# Patient Record
Sex: Male | Born: 2000 | Race: White | Hispanic: No | Marital: Single | Smoking: Never smoker
Health system: Southern US, Community
[De-identification: ages and names within clinical notes are randomized; demographics above are authoritative.]

---
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
## 2018-01-02 ENCOUNTER — Emergency Department (HOSPITAL_COMMUNITY)
Admission: EM | Admit: 2018-01-02 | Discharge: 2018-01-02 | Disposition: A | Discharge status: Home / Self Care | Payer: 59 | Attending: Emergency Medicine | Admitting: Emergency Medicine

## 2018-01-02 ENCOUNTER — Encounter (HOSPITAL_COMMUNITY): Payer: Self-pay | Admitting: Emergency Medicine

## 2018-01-02 DIAGNOSIS — R1011 Right upper quadrant pain: Secondary | ICD-10-CM | POA: Diagnosis present

## 2018-01-02 DIAGNOSIS — K5900 Constipation, unspecified: Secondary | ICD-10-CM | POA: Diagnosis not present

## 2018-01-02 LAB — COMPREHENSIVE METABOLIC PANEL
ALT: 27 U/L (ref 17–63)
AST: 31 U/L (ref 15–41)
Albumin: 4 g/dL (ref 3.5–5.0)
Alkaline Phosphatase: 90 U/L (ref 38–126)
Anion gap: 6 (ref 5–15)
BUN: 12 mg/dL (ref 6–20)
CO2: 30 mmol/L (ref 22–32)
CREATININE: 0.85 mg/dL (ref 0.61–1.24)
Calcium: 9.2 mg/dL (ref 8.9–10.3)
Chloride: 104 mmol/L (ref 101–111)
GFR calc Af Amer: >60 mL/min (ref >60)
Glucose, Bld: 96 mg/dL (ref 65–99)
Potassium: 4 mmol/L (ref 3.5–5.1)
Sodium: 140 mmol/L (ref 135–145)
TOTAL PROTEIN: 6.8 g/dL (ref 6.5–8.1)
Total Bilirubin: 1.3 mg/dL — ABNORMAL HIGH (ref 0.3–1.2)

## 2018-01-02 LAB — URINALYSIS, ROUTINE W REFLEX MICROSCOPIC
Bilirubin Urine: NEGATIVE
GLUCOSE, UA: NEGATIVE mg/dL
Hgb urine dipstick: NEGATIVE
KETONES UR: NEGATIVE mg/dL
LEUKOCYTES UA: NEGATIVE
NITRITE: NEGATIVE
PROTEIN: NEGATIVE mg/dL
Specific Gravity, Urine: 1.013 (ref 1.005–1.030)
pH: 7 (ref 5.0–8.0)

## 2018-01-02 LAB — CBC
HCT: 42.6 % (ref 39.0–52.0)
Hemoglobin: 14.4 g/dL (ref 13.0–17.0)
MCH: 30.1 pg (ref 26.0–34.0)
MCHC: 33.8 g/dL (ref 30.0–36.0)
MCV: 89.1 fL (ref 78.0–100.0)
PLATELETS: 188 10*3/uL (ref 150–400)
RBC: 4.78 MIL/uL (ref 4.22–5.81)
RDW: 12.3 % (ref 11.5–15.5)
WBC: 5.5 10*3/uL (ref 4.0–10.5)

## 2018-01-02 LAB — LIPASE, BLOOD: Lipase: 34 U/L (ref 11–51)

## 2018-01-02 MED ORDER — POLYETHYLENE GLYCOL 3350 17 G PO PACK: 17.0000 g | PACK | Freq: Every day | ORAL | 0 refills | Status: AC | PRN

## 2018-01-02 MED ORDER — PANTOPRAZOLE SODIUM 40 MG PO TBEC: 40.0000 mg | DELAYED_RELEASE_TABLET | Freq: Two times a day (BID) | ORAL | 0 refills | Status: AC

## 2018-01-02 MED ORDER — GI COCKTAIL ~~LOC~~
30.0000 mL | Freq: Once | ORAL | Status: AC
  Administered 2018-01-02: 30 mL via ORAL
  Filled 2018-01-02: qty 30

## 2018-01-02 MED ORDER — SUCRALFATE 1 GM/10ML PO SUSP: 1.0000 g | Freq: Three times a day (TID) | ORAL | 0 refills | Status: AC

## 2018-01-02 MED ORDER — MAGNESIUM CITRATE PO SOLN: 1.0000 | Freq: Once | ORAL | 0 refills | Status: AC

## 2018-01-02 NOTE — ED Notes (Signed)
Patient left at this time with all belongings. 

## 2018-01-02 NOTE — ED Triage Notes (Signed)
Reports having recurrent RUQ abdominal pain with heartburn for the last three months.  Had a ct scan here on Thursday.  Reports he feels like it is his gallbladder.

## 2018-01-02 NOTE — ED Provider Notes (Signed)
[facility] Provider Note   CSN: [id number] Arrival date & time: 01/02/18  0019     History   Chief Complaint Chief Complaint  Patient presents with  . Abdominal Pain    HPI Carlean JewsBenjamin Munar is a 17 y.o. male.  Patient presents to the ER for evaluation of abdominal pain.  He has been experiencing progressively worsening right upper quadrant and central upper abdominal pain for several months.  He has now developed acid reflux type indigestion symptoms with this.  He reports that pain worsens when he eats and he gets immediately bloated after eating.  He is feeling constipated and has noticed a change in his number of bowel movements as well as character of bowel movements.  Patient is undergoing evaluation by Dr. Loreta AveMann, gastroenterology.  He had an outpatient ultrasound of his right upper quadrant that did not show any acute gallbladder abnormality.  He had a HIDA scan that was normal.  He most recently had a CAT scan that showed constipation but no other abnormality.     History reviewed. No pertinent past medical history.  There are no active problems to display for this patient.   History reviewed. No pertinent surgical history.      Home Medications    Prior to Admission medications   Medication Sig Start Date End Date Taking? Authorizing Provider  magnesium citrate SOLN Take 296 mLs (1 Bottle total) by mouth once for 1 dose. 01/02/18 01/02/18  Gilda CreasePollina, Hallie Ertl J, MD  pantoprazole (PROTONIX) 40 MG tablet Take 1 tablet (40 mg total) by mouth 2 (two) times daily before a meal. 01/02/18   Cal Gindlesperger, Canary Brimhristopher J, MD  polyethylene glycol (MIRALAX / GLYCOLAX) packet Take 17 g by mouth daily as needed for moderate constipation. 01/02/18   Gilda CreasePollina, Nitish Roes J, MD  sucralfate (CARAFATE) 1 GM/10ML suspension Take 10 mLs (1 g total) by mouth 4 (four) times daily -  with meals and at bedtime. 01/02/18   Gilda CreasePollina, Star Cheese J, MD    Family  History No family history on file.  Social History Social History   Tobacco Use  . Smoking status: Never Smoker  . Smokeless tobacco: Never Used  Substance Use Topics  . Alcohol use: Yes    Comment: rarely  . Drug use: Never     Allergies   Patient has no known allergies.   Review of Systems Review of Systems  Gastrointestinal: Positive for abdominal pain and constipation.  All other systems reviewed and are negative.    Physical Exam Updated Vital Signs BP 130/85 (BP Location: Left Arm)   Pulse (!) 46   Temp (!) 97.4 F (36.3 C) (Oral)   Resp 18   Ht 5\' 11"  (1.803 m)   Wt 79.4 kg (175 lb)   SpO2 100%   BMI 24.41 kg/m   Physical Exam  Constitutional: He is oriented to person, place, and time. He appears well-developed and well-nourished. No distress.  HENT:  Head: Normocephalic and atraumatic.  Right Ear: Hearing normal.  Left Ear: Hearing normal.  Nose: Nose normal.  Mouth/Throat: Oropharynx is clear and moist and mucous membranes are normal.  Eyes: Pupils are equal, round, and reactive to light. Conjunctivae and EOM are normal.  Neck: Normal range of motion. Neck supple.  Cardiovascular: Regular rhythm, S1 normal and S2 normal. Exam reveals no gallop and no friction rub.  No murmur heard. Pulmonary/Chest: Effort normal and breath sounds normal. No respiratory distress. He exhibits no tenderness.  Abdominal:  Soft. Normal appearance and bowel sounds are normal. There is no hepatosplenomegaly. There is generalized tenderness and tenderness in the right upper quadrant. There is no rebound, no guarding, no tenderness at McBurney's point and negative Murphy's sign. No hernia.  Musculoskeletal: Normal range of motion.  Neurological: He is alert and oriented to person, place, and time. He has normal strength. No cranial nerve deficit or sensory deficit. Coordination normal. GCS eye subscore is 4. GCS verbal subscore is 5. GCS motor subscore is 6.  Skin: Skin is warm,  dry and intact. No rash noted. No cyanosis.  Psychiatric: He has a normal mood and affect. His speech is normal and behavior is normal. Thought content normal.  Nursing note and vitals reviewed.    ED Treatments / Results  Labs (all labs ordered are listed, but only abnormal results are displayed) Labs Reviewed  COMPREHENSIVE METABOLIC PANEL - Abnormal; Notable for the following components:      Result Value   Total Bilirubin 1.3 (*)    All other components within normal limits  URINALYSIS, ROUTINE W REFLEX MICROSCOPIC - Abnormal; Notable for the following components:   APPearance HAZY (*)    All other components within normal limits  LIPASE, BLOOD  CBC    EKG None  Radiology Ct Abdomen Pelvis W Contrast  Result Date: 01/01/2018 CLINICAL DATA:  Right upper quadrant pain for 3 months. EXAM: CT ABDOMEN AND PELVIS WITH CONTRAST TECHNIQUE: Multidetector CT imaging of the abdomen and pelvis was performed using the standard protocol following bolus administration of intravenous contrast. CONTRAST:  ISOVUE-300 IOPAMIDOL (ISOVUE-300) INJECTION 61% COMPARISON:  None. FINDINGS: Lower Chest: No acute findings. Hepatobiliary: No hepatic masses identified. Gallbladder is unremarkable. No evidence of biliary ductal dilatation. Pancreas:  No mass or inflammatory changes. Spleen: Within normal limits in size and appearance. Adrenals/Urinary Tract: No masses identified. No evidence of hydronephrosis. Stomach/Bowel: No evidence of obstruction, inflammatory process or abnormal fluid collections. Normal appendix visualized. Moderate to large amount of stool seen throughout colon. Vascular/Lymphatic: No pathologically enlarged lymph nodes. No abdominal aortic aneurysm. Reproductive:  No mass or other significant abnormality. Other:  None. Musculoskeletal: No suspicious bone lesions identified. 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 IMPRESSION:  No acute findings within the abdomen or pelvis. Moderate to large stool burden noted; recommend clinical correlation for possible constipation. Electronically Signed   By: Myles Rosenthal M.D.   On: 01/01/2018 09:35    Procedures Procedures (including critical care time)  Medications Ordered in ED Medications  gi cocktail (Maalox,Lidocaine,Donnatal) (30 mLs Oral Given 01/02/18 1610)     Initial Impression / Assessment and Plan / ED Course  I have reviewed the triage vital signs and the nursing notes.  Pertinent labs & imaging results that were available during my care of the patient were reviewed by me and considered in my medical decision making (see chart for details).     Examination reveals a very slight right upper quadrant and epigastric tenderness but no guarding or rebound.  Outpatient studies were reviewed.  He has had ultrasound, HIDA scan, CAT scan that does not show any acute abnormality.  Dr. Loreta Ave started Linzess and I agree with this, recommended he continue this.  CT scan did show increased stool burden, will give him a slight bowel prep.  I do believe that a lot of his symptoms are likely secondary to irritable bowel.  I cannot, however, rule out gastritis peptic ulcer disease, duodenal ulcer.  Patient  will therefore be aggressively treated for these possibilities empirically.  He will follow-up with Dr. Loreta Ave for consideration of endoscopy.  Final Clinical Impressions(s) / ED Diagnoses   Final diagnoses:  Right upper quadrant abdominal pain  Constipation, unspecified constipation type    ED Discharge Orders        Ordered    polyethylene glycol (MIRALAX / GLYCOLAX) packet  Daily PRN     01/02/18 0638    magnesium citrate SOLN   Once     01/02/18 0638    pantoprazole (PROTONIX) 40 MG tablet  2 times daily before meals     01/02/18 0638    sucralfate (CARAFATE) 1 GM/10ML suspension  3 times daily with meals & bedtime     01/02/18 1610       Gilda Crease,  MD 01/02/18 [phone]

## 2019-04-14 ENCOUNTER — Other Ambulatory Visit: Payer: Self-pay

## 2019-04-14 ENCOUNTER — Encounter: Payer: Self-pay | Admitting: Sports Medicine

## 2019-04-14 ENCOUNTER — Ambulatory Visit (INDEPENDENT_AMBULATORY_CARE_PROVIDER_SITE_OTHER): Payer: 59 | Admitting: Sports Medicine

## 2019-04-14 VITALS — BP 112/72 | Ht 71.0 in | Wt 175.0 lb

## 2019-04-14 DIAGNOSIS — Z8619 Personal history of other infectious and parasitic diseases: Secondary | ICD-10-CM | POA: Diagnosis not present

## 2019-04-14 DIAGNOSIS — I400 Infective myocarditis: Secondary | ICD-10-CM | POA: Diagnosis not present

## 2019-04-14 DIAGNOSIS — Z8616 Personal history of COVID-19: Secondary | ICD-10-CM | POA: Insufficient documentation

## 2019-04-14 NOTE — Progress Notes (Signed)
PCP: Patient, No Pcp Per  Subjective:   CC: Patient is a 18 y.o. male here for follow-up of COVID symptoms.  HPI: 18 year old male presents today with follow-up symptoms from previous COVID positive infection- positive test July 16 when his roommates were positive.  Patient states that he had mild symptoms at that time that included decreased smell and taste, mild sinus congestion, shortness of breath sensation described as feeling a warmth in the throat, and mind fogginess.  Patient denies having any fever, cough, dyspnea on exertion. Patient is most concerned about his heart rate being fast and he notices it when he is going to bed at night. He is able to calm himself down and the heart rate improves and he is able to fall asleep.  Patient has not been getting very good sleep at night as he is feeling overwhelmed with schoolwork and training demands. Patient states that he tries not to use caffeine to compensate for lack of sleep. He denies having overt symptoms of anxiety.  Denies syncopal symptoms.  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  Patient denies having difficulties with completing these training sessions.  Patient has had multiple imaging studies and doctors visits over the past year for GERD-like symptoms and constipation.  ROS: See HPI above.  I have personally reviewed pertinent past medical history, surgical, family, and social history as appropriate.     Objective:  BP 112/72   Ht 5\' 11"  (1.803 m)   Wt 175 lb (79.4 kg)   BMI 24.41 kg/m   Physical Exam: Gen: NAD, comfortable in exam room Cardiac: Normal S1-S2 auscultated.  Negative for murmur or gallops or rubs.  Squatting does not produce murmur.  Negative for JVD.  Distal pulses intact, extremities well perfused. Pulmonary: Negative for adventitious lung sounds.  Good airflow auscultated throughout lungs to bases bilaterally.  No increased work of breathing or  accessory muscle use.   Assessment & Plan:  History of 2019 novel coronavirus disease (COVID-19) -Physical exam today was reassuring -Cannot rule out myocarditis as sequelae of coronavirus.  Would also consider anxiety as contribution to symptoms. -Abstain from strenuous physical activity during query -Refer to cardiology for myocarditis query and clearance for return to training -Avoid caffeine -Practice good sleep habits   I independently examined pertinent imaging in relation to cc.  Doristine Mango, [facility] PGY-2  Patient seen and evaluated with the resident.  I agree with the above plan of care.  Patient has persistent symptoms status post COVID-19 infection.  Referral to cardiology to rule out myocarditis.  Absolutely no strenuous activity or exercise until evaluated by cardiology.  Patient understands.

## 2019-04-14 NOTE — Assessment & Plan Note (Signed)
-  Physical exam today was reassuring -Cannot rule out myocarditis as sequelae of coronavirus.  Would also consider anxiety as contribution to symptoms. -Abstain from strenuous physical activity during query -Refer to cardiology for myocarditis query and clearance for return to training -Avoid caffeine -Practice good sleep habits

## 2019-04-16 ENCOUNTER — Encounter (HOSPITAL_COMMUNITY): Payer: Self-pay | Admitting: *Deleted

## 2019-04-16 ENCOUNTER — Emergency Department (HOSPITAL_COMMUNITY): Payer: 59

## 2019-04-16 ENCOUNTER — Emergency Department (HOSPITAL_COMMUNITY)
Admission: EM | Admit: 2019-04-16 | Discharge: 2019-04-16 | Discharge status: Against Medical Advice | Payer: 59 | Attending: Emergency Medicine | Admitting: Emergency Medicine

## 2019-04-16 ENCOUNTER — Other Ambulatory Visit: Payer: Self-pay

## 2019-04-16 DIAGNOSIS — R0789 Other chest pain: Secondary | ICD-10-CM | POA: Diagnosis present

## 2019-04-16 DIAGNOSIS — Z5321 Procedure and treatment not carried out due to patient leaving prior to being seen by health care provider: Secondary | ICD-10-CM | POA: Diagnosis not present

## 2019-04-16 LAB — BASIC METABOLIC PANEL
Anion gap: 9 (ref 5–15)
BUN: 18 mg/dL (ref 6–20)
CO2: 26 mmol/L (ref 22–32)
Calcium: 9.2 mg/dL (ref 8.9–10.3)
Chloride: 103 mmol/L (ref 98–111)
Creatinine, Ser: 1.07 mg/dL (ref 0.61–1.24)
GFR calc Af Amer: >60 mL/min (ref >60)
GFR calc non Af Amer: >60 mL/min (ref >60)
Glucose, Bld: 92 mg/dL (ref 70–99)
Potassium: 4.1 mmol/L (ref 3.5–5.1)
Sodium: 138 mmol/L (ref 135–145)

## 2019-04-16 LAB — CBC
HCT: 47.5 % (ref 39.0–52.0)
Hemoglobin: 16 g/dL (ref 13.0–17.0)
MCH: 30.5 pg (ref 26.0–34.0)
MCHC: 33.7 g/dL (ref 30.0–36.0)
MCV: 90.6 fL (ref 80.0–100.0)
Platelets: 209 10*3/uL (ref 150–400)
RBC: 5.24 MIL/uL (ref 4.22–5.81)
RDW: 12.2 % (ref 11.5–15.5)
WBC: 6.9 10*3/uL (ref 4.0–10.5)
nRBC: 0 % (ref 0.0–0.2)

## 2019-04-16 LAB — TROPONIN I (HIGH SENSITIVITY): Troponin I (High Sensitivity): 3 ng/L (ref <18)

## 2019-04-16 MED ORDER — SODIUM CHLORIDE 0.9% FLUSH: 3.0000 mL | Freq: Once | INTRAVENOUS | Status: DC

## 2019-04-16 NOTE — ED Triage Notes (Signed)
The pt is c/o chest pain for one month.  He had the covid July 16th and he keeps having the chest pain since then

## 2019-05-18 ENCOUNTER — Encounter: Payer: Self-pay | Admitting: Sports Medicine

## 2019-05-18 ENCOUNTER — Other Ambulatory Visit: Payer: Self-pay

## 2019-05-18 ENCOUNTER — Ambulatory Visit (INDEPENDENT_AMBULATORY_CARE_PROVIDER_SITE_OTHER): Payer: 59 | Admitting: Sports Medicine

## 2019-05-18 VITALS — BP 120/64 | Ht 71.0 in | Wt 175.0 lb

## 2019-05-18 DIAGNOSIS — U071 COVID-19: Secondary | ICD-10-CM

## 2019-05-18 NOTE — Progress Notes (Addendum)
PCP: Patient, No Pcp Per  Subjective:   HPI: Patient is a 18 y.o. male here for sports clearance following COVID infection.  Patient was diagnosed with COVID this July.  He did have mild respiratory symptoms at that time.  Following COVID infection he developed mild chest pain that was exertional.  He was seen by cardiology at [facility] and had work-up including EKG, BMP, CBC, troponin.  Patient was cleared to do 60% of the physical activity that he typically would however the cardiologist recommended that he get a cardiac MRI before full return to sport.  Patient is a Theme park manager at [employer].  Patient notes since his last visit with the cardiologist his chest pain that is related to activity had completely resolved.  He notes with moderate aerobic activity he has no symptoms including chest pain, palpitations, shortness of breath, lightheadedness, dizziness.  He spoke with the cardiology office on Saturday who still recommended he get an MRI.  He was told that he would be contacted to schedule this however he notes he has not yet been called.  Patient was wondering if we have possible to either not get the cardiac MRI done and be cleared for sports since he is no longer having symptoms or if we could help coordinate the care to get the MRI done.   Review of Systems: See HPI above.  History reviewed. No pertinent past medical history.  Current Outpatient Medications on File Prior to Visit  Medication Sig Dispense Refill  . pantoprazole (PROTONIX) 40 MG tablet Take 1 tablet (40 mg total) by mouth 2 (two) times daily before a meal. (Patient not taking: Reported on 04/14/2019) 60 tablet 0  . polyethylene glycol (MIRALAX / GLYCOLAX) packet Take 17 g by mouth daily as needed for moderate constipation. (Patient not taking: Reported on 04/14/2019) 14 each 0  . sucralfate (CARAFATE) 1 GM/10ML suspension Take 10 mLs (1 g total) by mouth 4 (four) times daily -  with meals and at bedtime. (Patient not  taking: Reported on 04/14/2019) 420 mL 0   No current facility-administered medications on file prior to visit.     History reviewed. No pertinent surgical history.  No Known Allergies  Social History   Socioeconomic History  . Marital status: Single    Spouse name: Not on file  . Number of children: Not on file  . Years of education: Not on file  . Highest education level: Not on file  Occupational History  . Not on file  Social Needs  . Financial resource strain: Not on file  . Food insecurity    Worry: Not on file    Inability: Not on file  . Transportation needs    Medical: Not on file    Non-medical: Not on file  Tobacco Use  . Smoking status: Never Smoker  . Smokeless tobacco: Never Used  Substance and Sexual Activity  . Alcohol use: Yes    Comment: rarely  . Drug use: Never  . Sexual activity: Not on file  Lifestyle  . Physical activity    Days per week: Not on file    Minutes per session: Not on file  . Stress: Not on file  Relationships  . Social Herbalist on phone: Not on file    Gets together: Not on file    Attends religious service: Not on file    Active member of club or organization: Not on file    Attends meetings of clubs or  organizations: Not on file    Relationship status: Not on file  . Intimate partner violence    Fear of current or ex partner: Not on file    Emotionally abused: Not on file    Physically abused: Not on file    Forced sexual activity: Not on file  Other Topics Concern  . Not on file  Social History Narrative  . Not on file    History reviewed. No pertinent family history.      Objective:  Physical Exam: BP 120/64   Ht 5\' 11"  (1.803 m)   Wt 175 lb (79.4 kg)   BMI 24.41 kg/m  Gen: NAD, comfortable in exam room Lungs: Breathing comfortably on room air.  Lungs clear to auscultation bilaterally Heart: Normal S1, S2.  No murmurs rubs or gallops.  Pulses regular.   Assessment & Plan:  Patient is a 18  y.o. male here for sports clearance following COVID-19 infection  1.  Status post COVID-19 infection with postinfectious exertional chest pain -Patient's chest pain is completely resolved since he was last seen by cardiology.  He has so far had a normal work-up but cardiology recommended he get a cardiac MRI prior to participating in sport - Patient would not be cleared for sports until he gets clearance from cardiology -I recommended that patient call the cardiologist to get the cardiac MRI scheduled  Patient will call me after he gets clearance from cardiology and we can finalize his return to sport pending cardiology approval  Addendum:  Patient seen in the office by fellow.  His history, exam, plan of care were precepted with me.  26 MD Norton Blizzard

## 2019-05-19 ENCOUNTER — Encounter: Payer: Self-pay | Admitting: Family Medicine

## 2019-06-09 ENCOUNTER — Other Ambulatory Visit: Payer: Self-pay

## 2019-06-09 DIAGNOSIS — Z20822 Contact with and (suspected) exposure to covid-19: Secondary | ICD-10-CM

## 2019-06-11 LAB — NOVEL CORONAVIRUS, NAA: SARS-CoV-2, NAA: NOT DETECTED

## 2019-08-18 ENCOUNTER — Ambulatory Visit: Payer: Managed Care, Other (non HMO) | Attending: Internal Medicine

## 2019-08-18 DIAGNOSIS — Z20822 Contact with and (suspected) exposure to covid-19: Secondary | ICD-10-CM

## 2019-08-19 LAB — NOVEL CORONAVIRUS, NAA: SARS-CoV-2, NAA: NOT DETECTED

## 2019-11-03 ENCOUNTER — Ambulatory Visit: Payer: 59 | Attending: Internal Medicine

## 2019-11-03 DIAGNOSIS — Z23 Encounter for immunization: Secondary | ICD-10-CM

## 2019-11-03 NOTE — Progress Notes (Signed)
   Covid-19 Vaccination Clinic  Name:  Marquie Aderhold    MRN: [id number] DOB: [birth year]  11/03/2019  Mr. Hannis was observed post Covid-19 immunization for 15 minutes without incident. He was provided with Vaccine Information Sheet and instruction to access the V-Safe system.   Mr. Hocker was instructed to call 911 with any severe reactions post vaccine: Marland Kitchen Difficulty breathing  . Swelling of face and throat  . A fast heartbeat  . A bad rash all over body  . Dizziness and weakness   Immunizations Administered    Name Date Dose VIS Date Route   Pfizer COVID-19 Vaccine 11/03/2019 10:52 AM 0.3 mL 07/15/2019 Intramuscular   Manufacturer: [employer], [employer]   Lot: [id number]   NDC: [address]-9

## 2019-11-04 ENCOUNTER — Ambulatory Visit: Payer: Managed Care, Other (non HMO)

## 2019-11-28 ENCOUNTER — Ambulatory Visit: Payer: 59 | Attending: Internal Medicine

## 2019-11-28 DIAGNOSIS — Z23 Encounter for immunization: Secondary | ICD-10-CM

## 2019-11-28 NOTE — Progress Notes (Signed)
   Covid-19 Vaccination Clinic  Name:  Sanjith Siwek    MRN: [id number] DOB: 04-19-2001  11/28/2019  Mr. Olshefski was observed post Covid-19 immunization for 15 minutes without incident. He was provided with Vaccine Information Sheet and instruction to access the V-Safe system.   Mr. Davee was instructed to call 911 with any severe reactions post vaccine: Marland Kitchen Difficulty breathing  . Swelling of face and throat  . A fast heartbeat  . A bad rash all over body  . Dizziness and weakness   Immunizations Administered    Name Date Dose VIS Date Route   Pfizer COVID-19 Vaccine 11/28/2019 10:57 AM 0.3 mL 09/28/2018 Intramuscular   Manufacturer: [employer], [employer]   Lot: [id number]   NDC: [address]-0

## 2019-12-25 IMAGING — CR DG CHEST 2V
2 series · 2 of 2 positions shown · non-contrast
Comparison: None.

CLINICAL DATA: Chest pain, 5IHQR-0R positive [DATE]

EXAM:
CHEST - 2 VIEW

[chest pa]
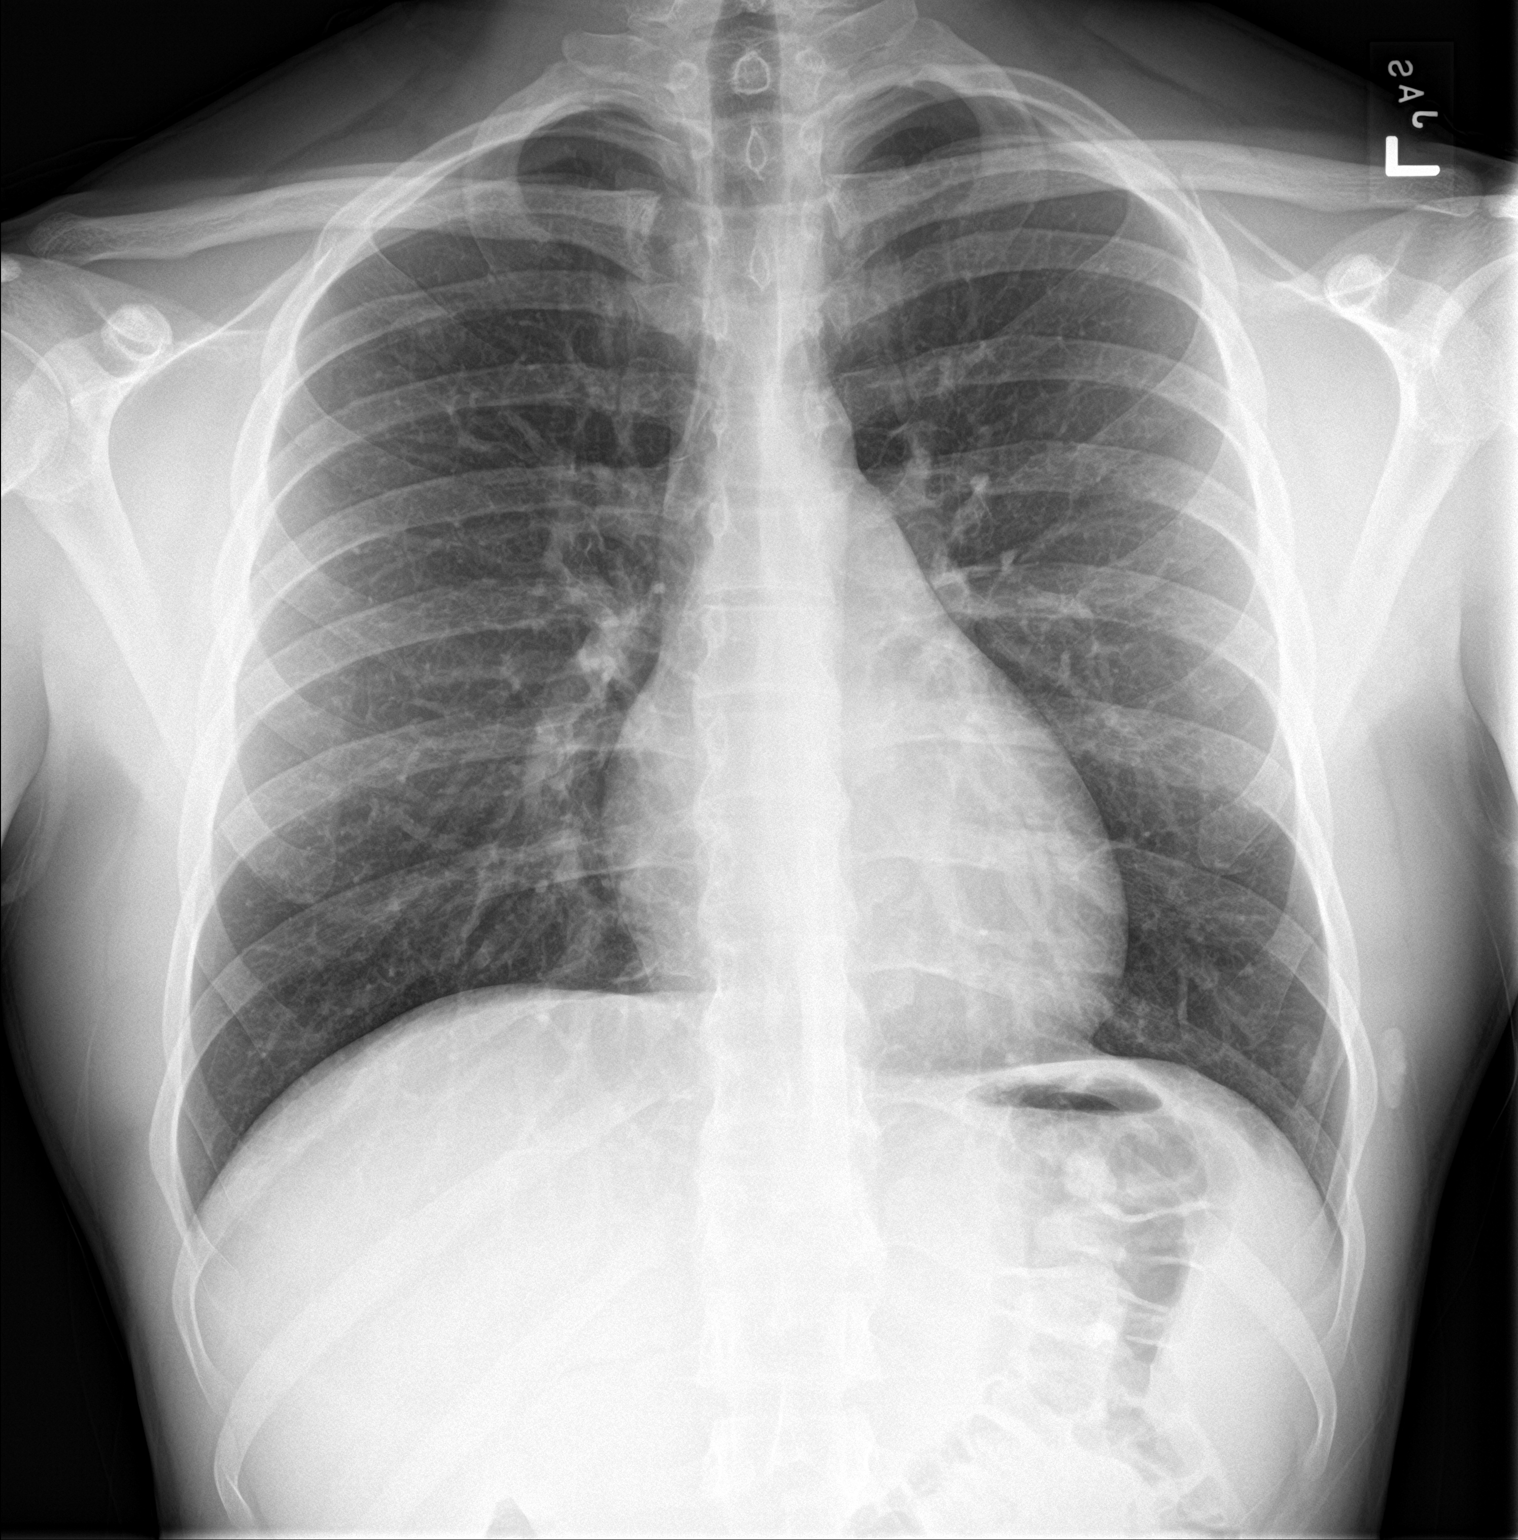

[chest lat]
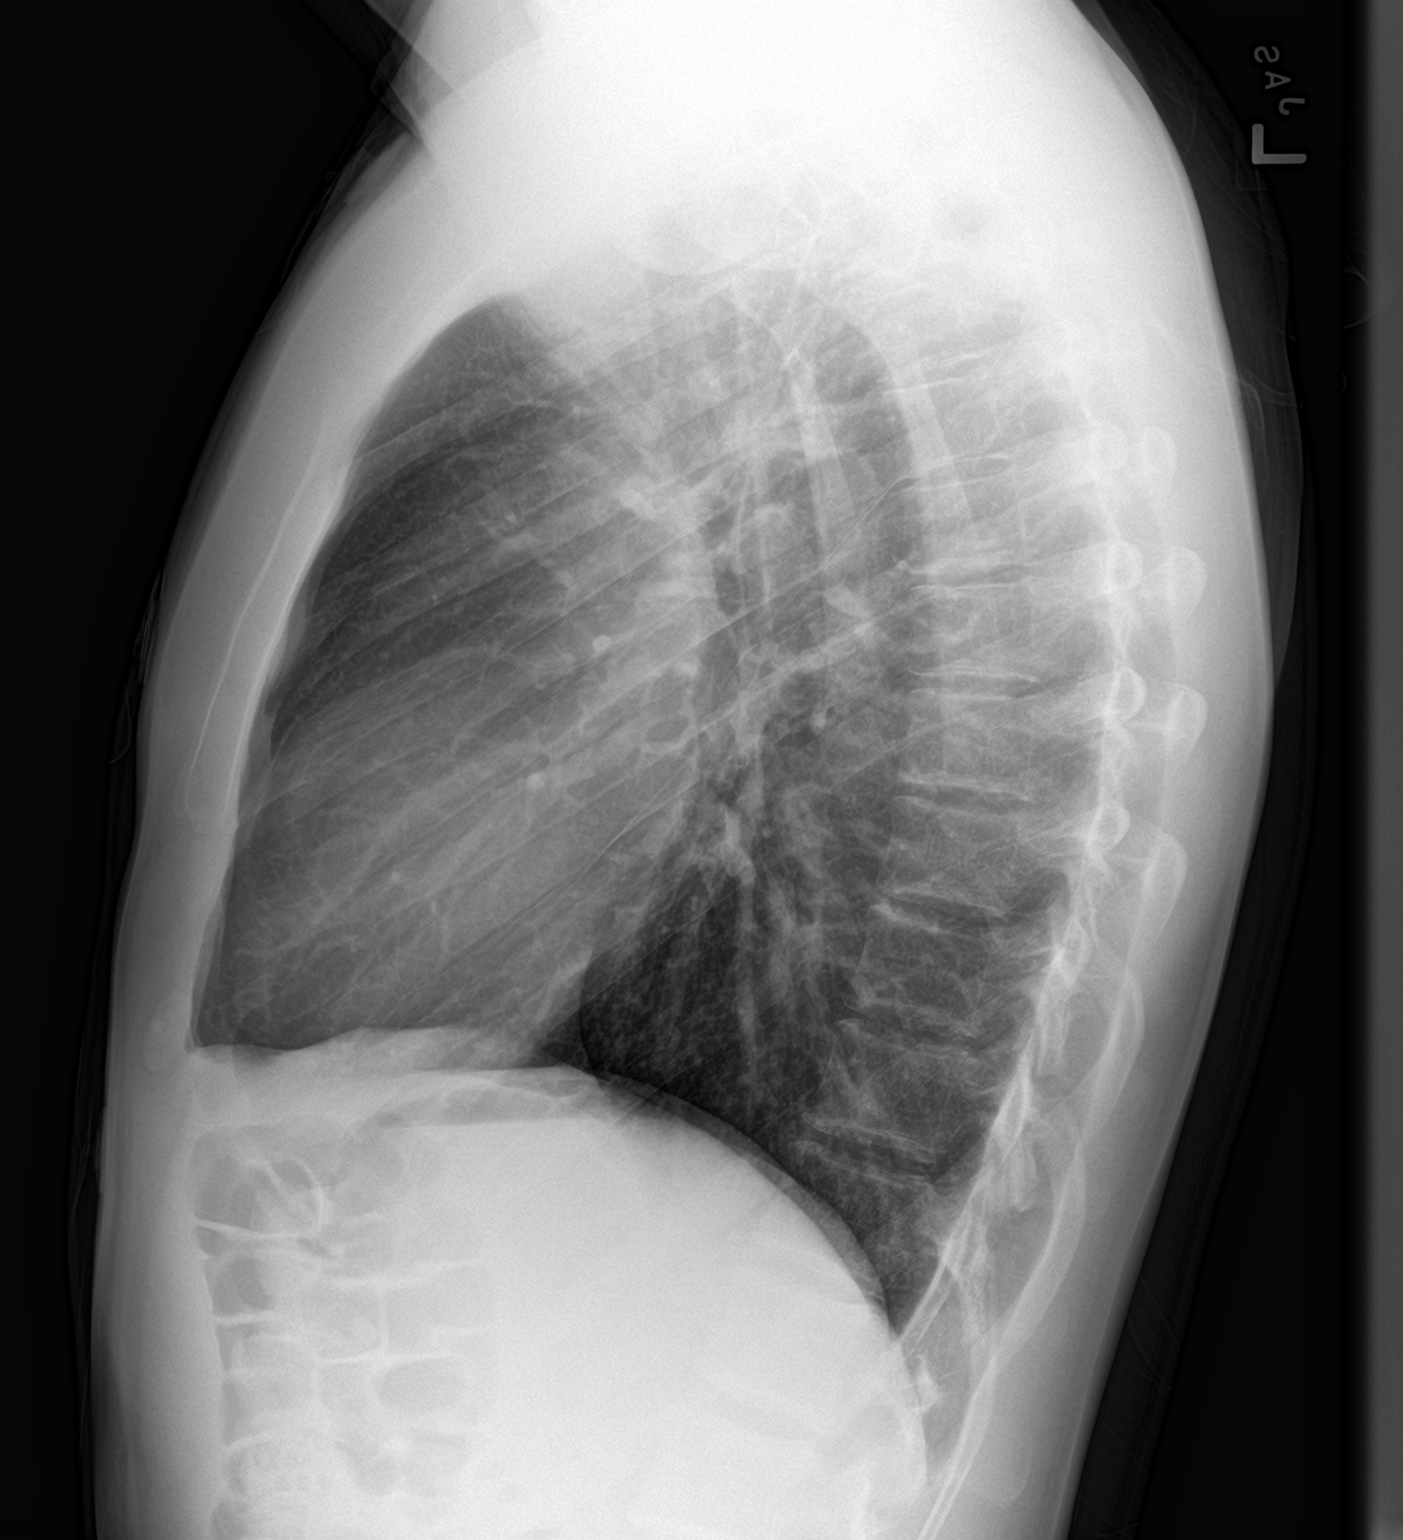

[2 of 2 positions shown; findings below may reference images not displayed]

FINDINGS: No consolidation, features of edema, pneumothorax, or effusion.
Pulmonary vascularity is normally distributed. The cardiomediastinal
contours are unremarkable. No acute osseous or soft tissue
abnormality.
IMPRESSION: No acute or residual cardiopulmonary abnormality.

## 2020-03-19 ENCOUNTER — Telehealth: Payer: Self-pay | Admitting: Sports Medicine

## 2020-03-19 NOTE — Telephone Encounter (Signed)
  Please note that this is a training room note and not a telephone call  I saw Cody Graves at preparticipation physicals on August 14.  He was diagnosed with Covid last year and referred to cardiology.  Cody Graves tells me that a cardiac MRI done at [facility] at that time was unremarkable.  He has returned to all athletics without any problems.  He continues to deny chest pain, shortness of breath, or fatigue.  I see no reason why he cannot continue with all athletics so I will clear him for full participation.  Follow-up as needed.

## 2020-07-09 ENCOUNTER — Ambulatory Visit: Payer: Self-pay | Attending: Internal Medicine

## 2020-07-09 DIAGNOSIS — Z23 Encounter for immunization: Secondary | ICD-10-CM

## 2020-07-09 NOTE — Progress Notes (Signed)
   Covid-19 Vaccination Clinic  Name:  Cody Graves    MRN: [id number] DOB: 04/21/2001  07/09/2020  [name] was observed post Covid-19 immunization for 15 minutes without incident. He was provided with Vaccine Information Sheet and instruction to access the V-Safe system.   [name] was instructed to call 911 with any severe reactions post vaccine: . Difficulty breathing  . Swelling of face and throat  . A fast heartbeat  . A bad rash all over body  . Dizziness and weakness   Immunizations Administered    Name Date Dose VIS Date Route   Pfizer COVID-19 Vaccine 07/09/2020  3:22 PM 0.3 mL 05/23/2020 Intramuscular   Manufacturer: Pfizer, Inc   Lot: [id number]   NDC: [address]-2     

## 2020-07-09 NOTE — Progress Notes (Signed)
   Covid-19 Vaccination Clinic  Name:  Cody Graves    MRN: [id number] DOB: [birth year]  07/09/2020  [name] was observed post Covid-19 immunization for 15 minutes without incident. He was provided with Vaccine Information Sheet and instruction to access the V-Safe system.   [name] was instructed to call 911 with any severe reactions post vaccine: Marland Kitchen Difficulty breathing  . Swelling of face and throat  . A fast heartbeat  . A bad rash all over body  . Dizziness and weakness   Immunizations Administered    Name Date Dose VIS Date Route   Pfizer COVID-19 Vaccine 07/09/2020  3:22 PM 0.3 mL 05/23/2020 Intramuscular   Manufacturer: [employer], [employer]   Lot: [id number]   NDC: [address]-4
